# Patient Record
Sex: Male | Born: 1985 | Race: White | Hispanic: No | Marital: Married
Health system: Southern US, Community
[De-identification: ages and names within clinical notes are randomized; demographics above are authoritative.]

---
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
## 2010-08-07 ENCOUNTER — Emergency Department (HOSPITAL_COMMUNITY)
Admission: EM | Admit: 2010-08-07 | Discharge: 2010-08-07 | Payer: Self-pay | Source: Home / Self Care | Admitting: Emergency Medicine

## 2010-08-08 ENCOUNTER — Emergency Department (HOSPITAL_COMMUNITY)
Admission: EM | Admit: 2010-08-08 | Discharge: 2010-08-08 | Payer: Self-pay | Source: Home / Self Care | Admitting: Emergency Medicine

## 2010-08-15 ENCOUNTER — Ambulatory Visit (HOSPITAL_COMMUNITY)
Admission: RE | Admit: 2010-08-15 | Discharge: 2010-08-15 | Payer: Self-pay | Source: Home / Self Care | Attending: Orthopedic Surgery | Admitting: Orthopedic Surgery

## 2010-09-09 ENCOUNTER — Encounter: Payer: Self-pay | Admitting: Family Medicine

## 2016-01-19 DIAGNOSIS — K5720 Diverticulitis of large intestine with perforation and abscess without bleeding: Secondary | ICD-10-CM | POA: Insufficient documentation

## 2016-02-02 DIAGNOSIS — K5720 Diverticulitis of large intestine with perforation and abscess without bleeding: Secondary | ICD-10-CM | POA: Insufficient documentation

## 2018-11-10 DIAGNOSIS — M47816 Spondylosis without myelopathy or radiculopathy, lumbar region: Secondary | ICD-10-CM | POA: Insufficient documentation

## 2018-11-10 DIAGNOSIS — M5136 Other intervertebral disc degeneration, lumbar region: Secondary | ICD-10-CM | POA: Insufficient documentation

## 2018-11-10 DIAGNOSIS — M5416 Radiculopathy, lumbar region: Secondary | ICD-10-CM | POA: Insufficient documentation

## 2019-03-10 ENCOUNTER — Other Ambulatory Visit: Payer: Self-pay | Admitting: Specialist

## 2019-03-10 DIAGNOSIS — M545 Low back pain, unspecified: Secondary | ICD-10-CM

## 2019-03-28 ENCOUNTER — Other Ambulatory Visit: Payer: Self-pay

## 2019-04-13 ENCOUNTER — Other Ambulatory Visit: Payer: Self-pay | Admitting: Specialist

## 2019-04-13 DIAGNOSIS — M545 Low back pain, unspecified: Secondary | ICD-10-CM

## 2019-04-13 DIAGNOSIS — G8929 Other chronic pain: Secondary | ICD-10-CM

## 2019-04-21 ENCOUNTER — Other Ambulatory Visit: Payer: Self-pay | Admitting: Specialist

## 2019-04-21 ENCOUNTER — Other Ambulatory Visit: Payer: Self-pay

## 2019-04-21 ENCOUNTER — Ambulatory Visit
Admission: RE | Admit: 2019-04-21 | Discharge: 2019-04-21 | Disposition: A | Discharge status: Home / Self Care | Payer: Self-pay | Source: Ambulatory Visit | Attending: Specialist | Admitting: Specialist

## 2019-04-21 ENCOUNTER — Ambulatory Visit
Admission: RE | Admit: 2019-04-21 | Discharge: 2019-04-21 | Disposition: A | Discharge status: Home / Self Care | Payer: Worker's Compensation | Source: Ambulatory Visit | Attending: Specialist | Admitting: Specialist

## 2019-04-21 VITALS — BP 126/75 | HR 60

## 2019-04-21 DIAGNOSIS — E669 Obesity, unspecified: Secondary | ICD-10-CM | POA: Insufficient documentation

## 2019-04-21 DIAGNOSIS — M5416 Radiculopathy, lumbar region: Secondary | ICD-10-CM

## 2019-04-21 DIAGNOSIS — G8929 Other chronic pain: Secondary | ICD-10-CM

## 2019-04-21 DIAGNOSIS — M5136 Other intervertebral disc degeneration, lumbar region: Secondary | ICD-10-CM

## 2019-04-21 DIAGNOSIS — E66811 Obesity, class 1: Secondary | ICD-10-CM | POA: Insufficient documentation

## 2019-04-21 DIAGNOSIS — M47816 Spondylosis without myelopathy or radiculopathy, lumbar region: Secondary | ICD-10-CM

## 2019-04-21 DIAGNOSIS — F17200 Nicotine dependence, unspecified, uncomplicated: Secondary | ICD-10-CM | POA: Insufficient documentation

## 2019-04-21 DIAGNOSIS — R52 Pain, unspecified: Secondary | ICD-10-CM

## 2019-04-21 MED ORDER — IOPAMIDOL (ISOVUE-M 200) INJECTION 41%
15.0000 mL | Freq: Once | INTRAMUSCULAR | Status: AC
  Administered 2019-04-21: 15 mL via INTRATHECAL

## 2019-04-21 MED ORDER — DIAZEPAM 5 MG PO TABS
10.0000 mg | ORAL_TABLET | Freq: Once | ORAL | Status: AC
  Administered 2019-04-21: 5 mg via ORAL

## 2019-04-21 NOTE — Discharge Instructions (Signed)

## 2021-07-02 IMAGING — CT CT L SPINE W/ CM
1 of 7 series · 6 of 14 positions shown, 8 images · non-contrast
Comparison: Outside lumbar spine MRI dated 10/23/2018

CLINICAL DATA: Chronic low back pain and bilateral leg pain.
TECHNIQUE: Contiguous axial images were obtained through the Lumbar spine after
the intrathecal infusion of infusion. Coronal and sagittal
reconstructions were obtained of the axial image sets.

[Series 3: l spine soft · axial · 0.34mm/px · z∈[-253,-46]mm · 6 of 97 slices shown, 8 images]
[im 14/97  soft-tissue]
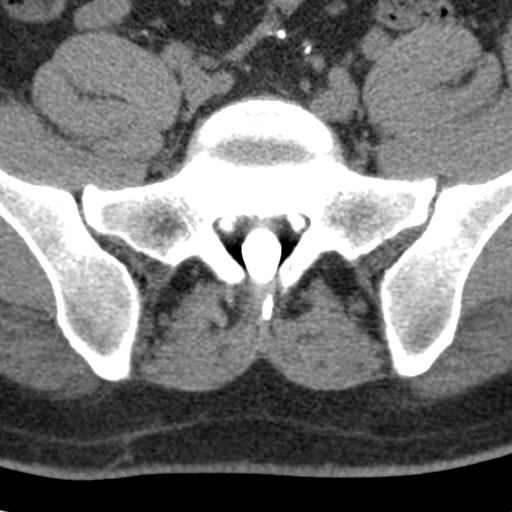
[im 14/97  bone]
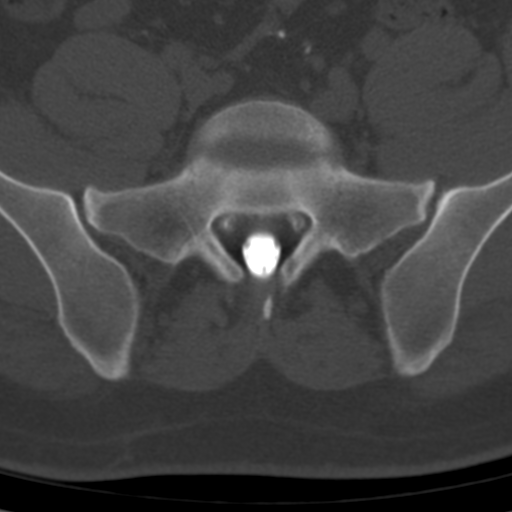
[im 28/97  bone]
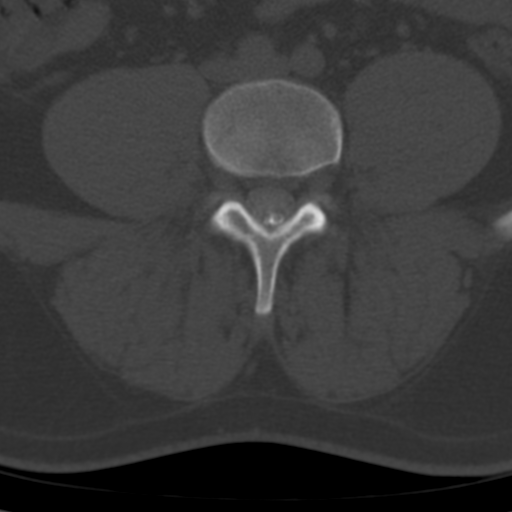
[im 42/97  bone]
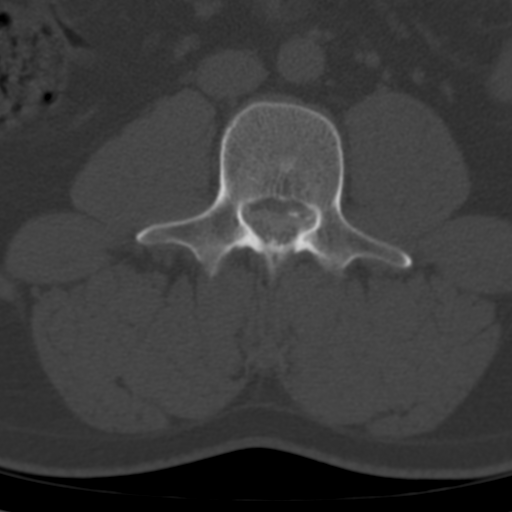
[im 55/97  bone]
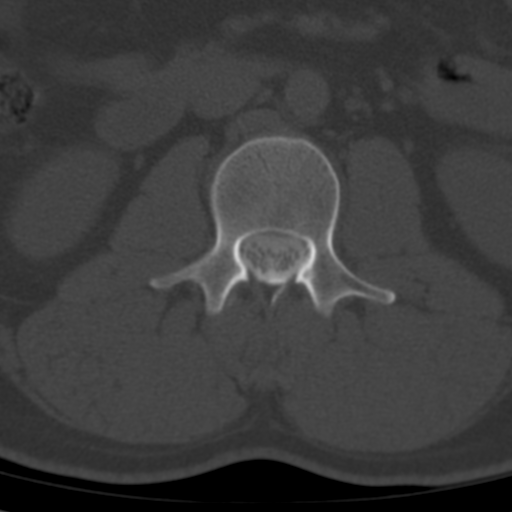
[im 69/97  soft-tissue]
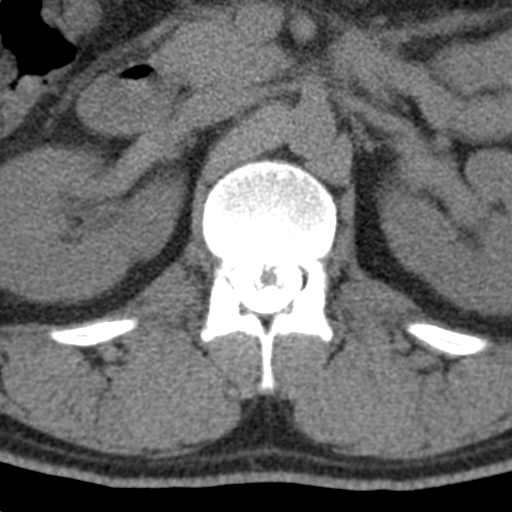
[im 69/97  bone]
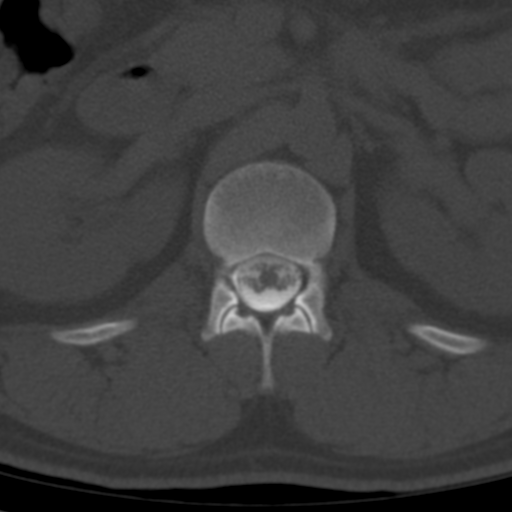
[im 83/97  bone]
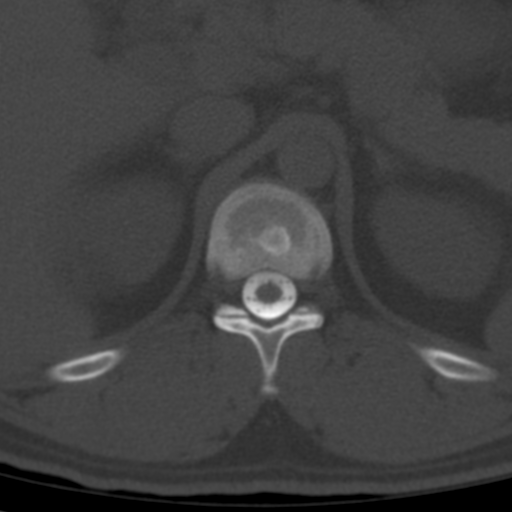

[6 of 14 positions shown; findings below may reference images not displayed]

EXAM:
LUMBAR MYELOGRAM

FLUOROSCOPY TIME:  Fluoroscopy Time: 1 minute 35 seconds

Radiation Exposure Index: 409.39 microGray*m^2

PROCEDURE:
After thorough discussion of risks and benefits of the procedure
including bleeding, infection, injury to nerves, blood vessels,
adjacent structures as well as headache and CSF leak, written and
oral informed consent was obtained. Consent was obtained by Dr.
Seniore Ich. Time out form was completed.

Patient was positioned prone on the fluoroscopy table. Local
anesthesia was provided with 1% lidocaine without epinephrine after
prepped and draped in the usual sterile fashion. Puncture was
performed at L2-3 using a 3 1/2 inch 22-gauge spinal needle via a
left interlaminar approach. Using a single pass through the dura,
the needle was placed within the thecal sac, with return of clear
CSF. 15 mL of Isovue J-AAA was injected into the thecal sac, with
normal opacification of the nerve roots and cauda equina consistent
with free flow within the subarachnoid space.

I personally performed the lumbar puncture and administered the
intrathecal contrast. I also personally supervised acquisition of
the myelogram images.
FINDINGS: LUMBAR MYELOGRAM FINDINGS:

There are 5 non rib-bearing lumbar type vertebrae. Trace
retrolisthesis of L2 on L3 and L3 on L4 does not change with flexion
or extension. There are small ventral extradural defects at L2-3,
L3-4, and L4-5 which are more notable with standing, however no
significant spinal stenosis is evident. There is symmetric nerve
root sleeve filling.

CT LUMBAR MYELOGRAM FINDINGS:

There is trace retrolisthesis of L2 on L3 and L3 on L4, and there is
slight left convex curvature of the lumbar spine. No fracture or
destructive osseous process is identified. A 7 mm sclerotic focus in
the right sacral ala may represent a bone island. There is mildly
asymmetric sclerosis in the left sacrum along the SI joint. The
conus medullaris terminates at L1. Postsurgical changes are
partially visualized in the rectosigmoid colon.

T10-11 through L1-2: Negative.

L2-3: Mild disc space narrowing. Small left paracentral disc
protrusion without stenosis, unchanged.

L3-4: Mild disc space narrowing. Small central disc protrusion and
mild disc bulging result in borderline bilateral lateral recess
stenosis without significant generalized spinal stenosis or neural
foraminal stenosis.

L4-5: Mild disc space narrowing. Mild disc bulging result in
borderline to mild bilateral neural foraminal stenosis without
spinal stenosis, unchanged.

L5-S1: Mild-to-moderate disc space narrowing. Mild disc bulging and
a small superimposed central disc osteophyte complex without
stenosis, unchanged.
IMPRESSION: Mild multilevel lumbar disc degeneration without definite neural
impingement.
